# Patient Record
Sex: Female | Born: 1955 | Race: White | Hispanic: No
Health system: Southern US, Community
[De-identification: ages and names within clinical notes are randomized; demographics above are authoritative.]

---

## 1999-04-20 ENCOUNTER — Ambulatory Visit (HOSPITAL_BASED_OUTPATIENT_CLINIC_OR_DEPARTMENT_OTHER): Admission: RE | Admit: 1999-04-20 | Discharge: 1999-04-20 | Payer: Self-pay | Admitting: General Surgery

## 2015-11-05 DIAGNOSIS — F32A Depression, unspecified: Secondary | ICD-10-CM | POA: Insufficient documentation

## 2015-11-05 DIAGNOSIS — E039 Hypothyroidism, unspecified: Secondary | ICD-10-CM | POA: Insufficient documentation

## 2015-11-05 DIAGNOSIS — E7800 Pure hypercholesterolemia, unspecified: Secondary | ICD-10-CM | POA: Insufficient documentation

## 2015-11-05 DIAGNOSIS — F17200 Nicotine dependence, unspecified, uncomplicated: Secondary | ICD-10-CM | POA: Insufficient documentation

## 2017-07-31 ENCOUNTER — Other Ambulatory Visit: Payer: Self-pay | Admitting: Orthopedic Surgery

## 2017-07-31 DIAGNOSIS — M545 Low back pain: Secondary | ICD-10-CM

## 2017-08-06 ENCOUNTER — Ambulatory Visit
Admission: RE | Admit: 2017-08-06 | Discharge: 2017-08-06 | Disposition: A | Discharge status: Home / Self Care | Payer: Self-pay | Source: Ambulatory Visit | Attending: Orthopedic Surgery | Admitting: Orthopedic Surgery

## 2017-08-06 DIAGNOSIS — M545 Low back pain: Secondary | ICD-10-CM

## 2017-08-12 ENCOUNTER — Other Ambulatory Visit: Payer: Self-pay

## 2017-08-15 ENCOUNTER — Ambulatory Visit
Admission: RE | Admit: 2017-08-15 | Discharge: 2017-08-15 | Disposition: A | Discharge status: Home / Self Care | Payer: 59 | Source: Ambulatory Visit | Attending: Orthopedic Surgery | Admitting: Orthopedic Surgery

## 2019-02-10 DIAGNOSIS — L918 Other hypertrophic disorders of the skin: Secondary | ICD-10-CM | POA: Insufficient documentation

## 2019-03-24 IMAGING — MR MR LUMBAR SPINE W/O CM
4 of 5 series · 25 of 48 positions shown · non-contrast
Comparison: [REDACTED] lumbar radiographs
11/26/2009

CLINICAL DATA: 61-year-old female. Lumbar back pain radiating down
the left leg with weakness and numbness since 07/22/2017. No known
injury.

EXAM:
MRI LUMBAR SPINE WITHOUT CONTRAST
TECHNIQUE: Multiplanar, multisequence MR imaging of the lumbar spine was
performed. No intravenous contrast was administered.

[Series 5: T2 post-contrast · sagittal · 4.0mm · 0.55mm/px · 6 of 13 slices shown]
[im 1/13]
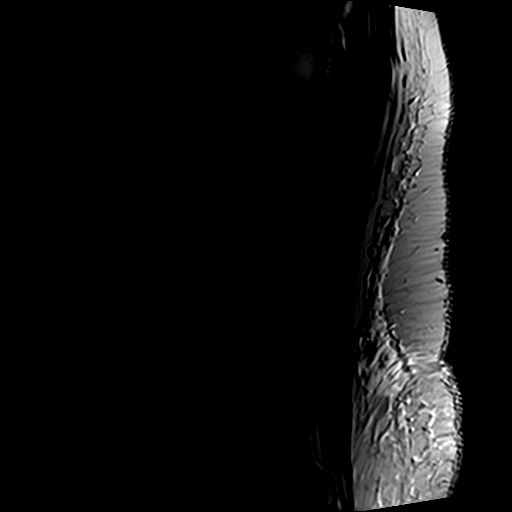
[im 3/13]
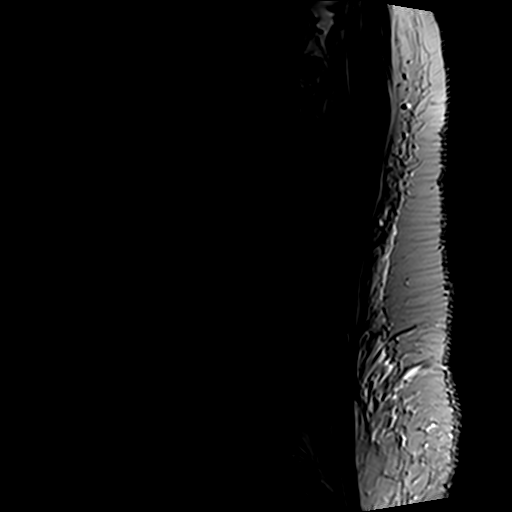
[im 5/13]
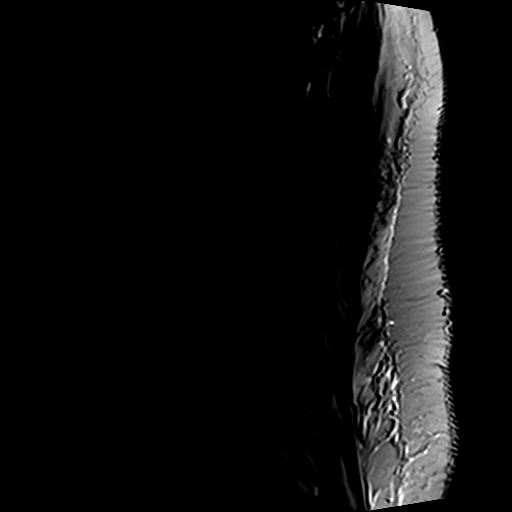
[im 8/13]
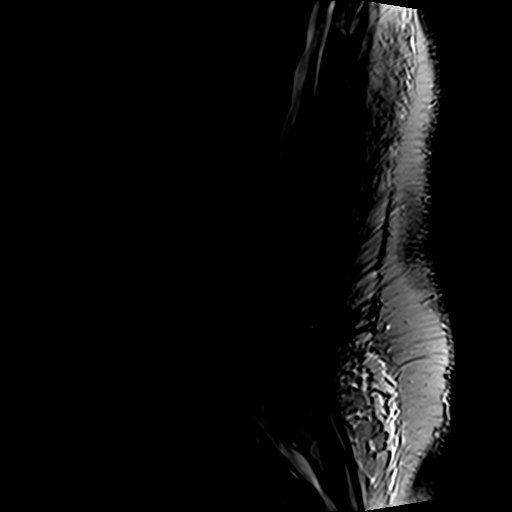
[im 10/13]
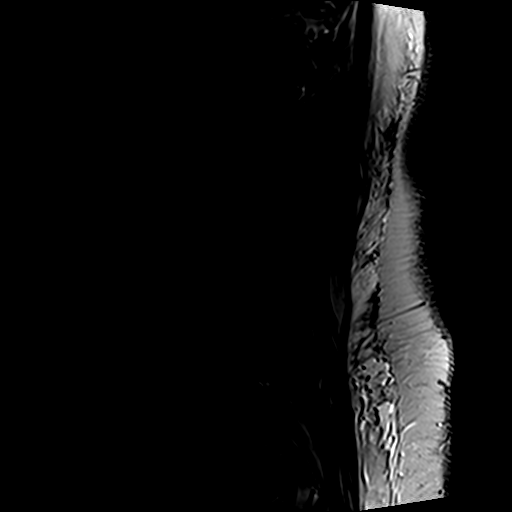
[im 13/13]
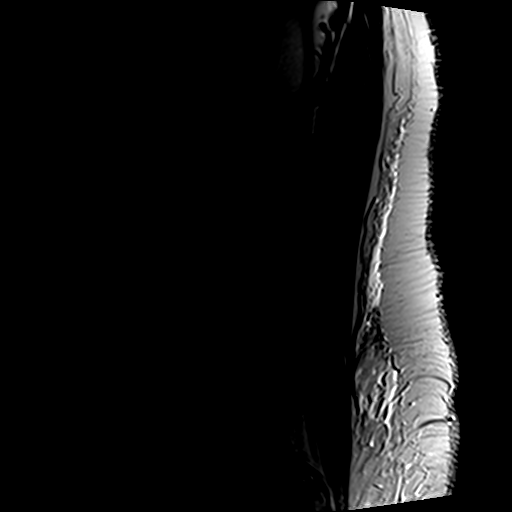

[Series 7: T1 · sagittal · 4.0mm · 0.55mm/px · 5 of 13 slices shown (1 of 2)]
[im 1/13]
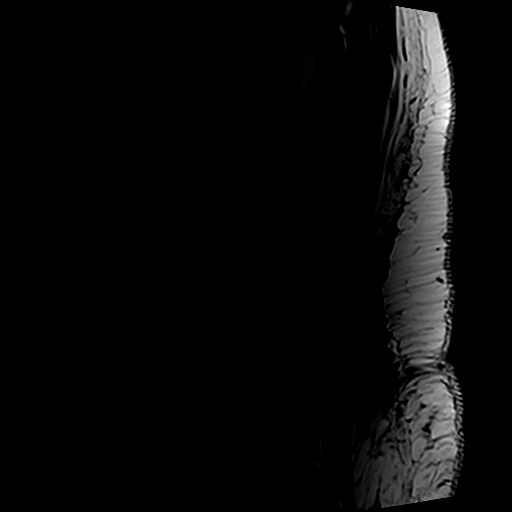
[im 4/13]
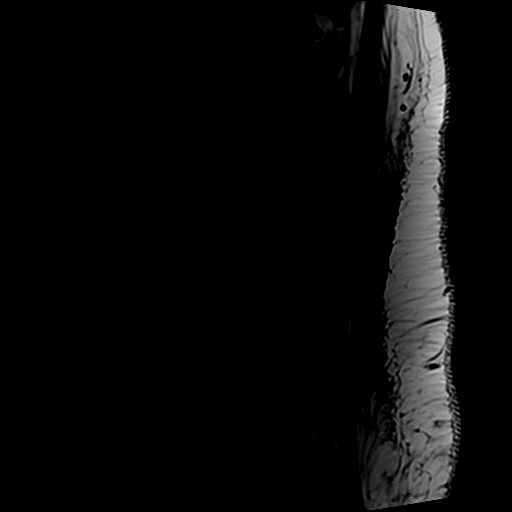
[im 7/13]
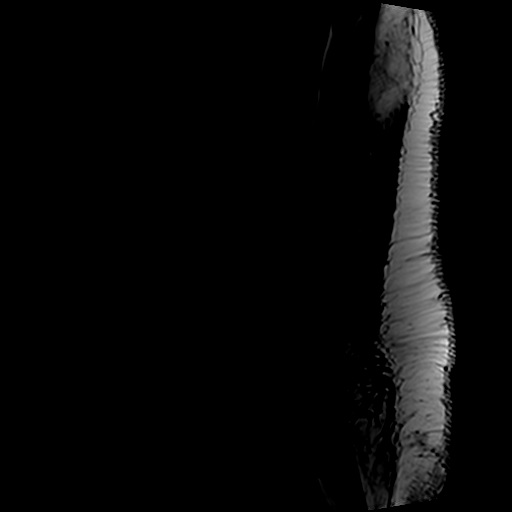
[im 10/13]
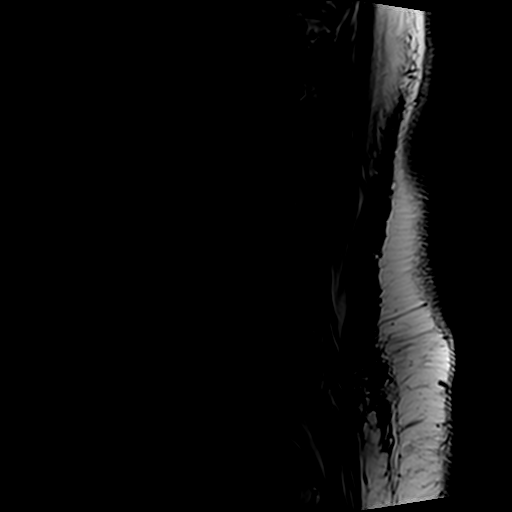
[im 13/13]
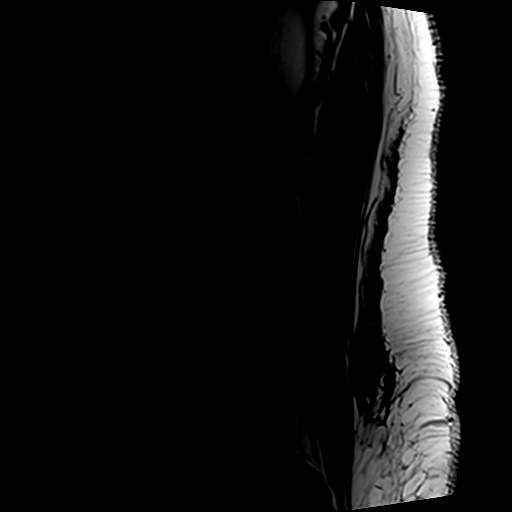

[Series 8: T2 · axial · 4.0mm · 0.70mm/px · z∈[-111,+110]mm · 10 of 39 slices shown]
[im 3/39]
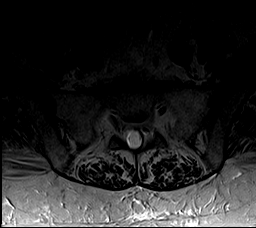
[im 6/39]
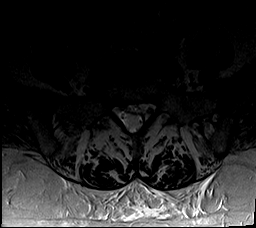
[im 8/39]
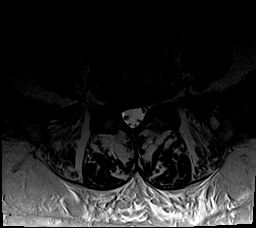
[im 13/39]
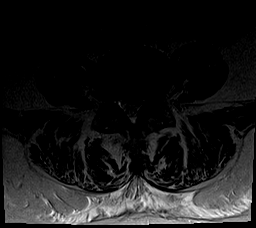
[im 18/39]
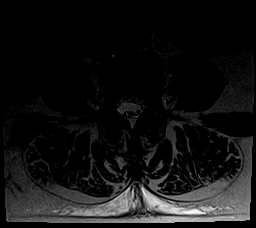
[im 21/39]
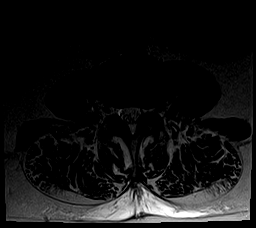
[im 23/39]
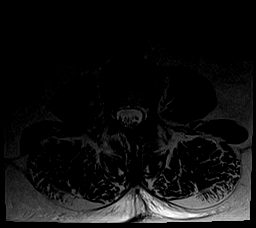
[im 28/39]
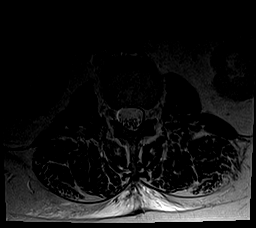
[im 33/39]
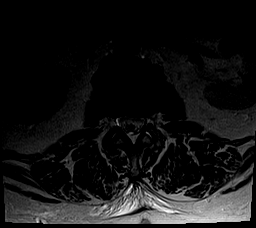
[im 39/39]
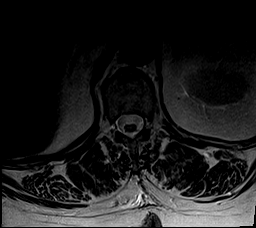

[Series 9: T1 · axial · 4.0mm · 0.35mm/px · z∈[-109,+67]mm · 4 of 39 slices shown (2 of 2)]
[im 3/39]
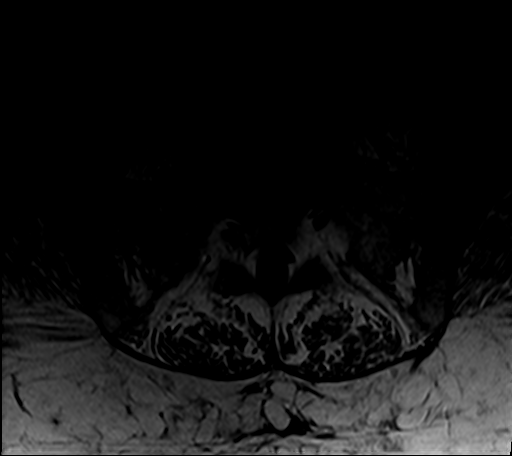
[im 6/39]
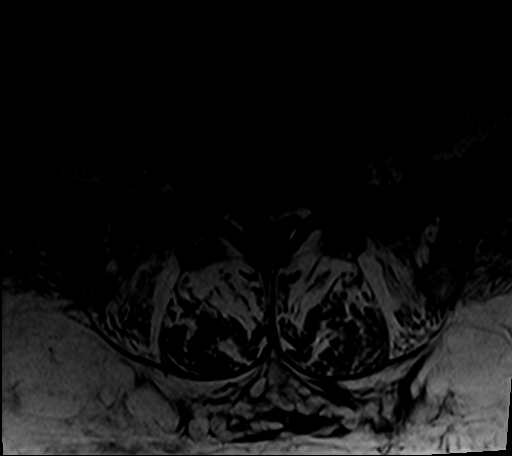
[im 21/39]
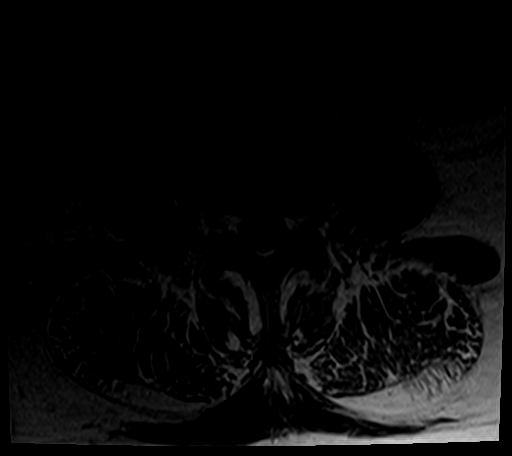
[im 33/39]
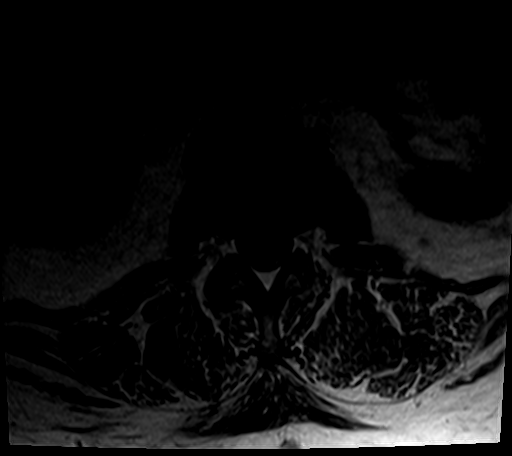

[25 of 48 positions shown; findings below may reference images not displayed]

FINDINGS: Segmentation: Demonstrated to be normal on the comparison
radiographs.

Alignment: Improved lumbar lordosis compared to 5999. Mild
retrolisthesis of L1 on L2 appears stable.

Vertebrae: Chronic degenerative endplate marrow signal changes at
L5-S1. Background bone marrow signal is normal. No marrow edema or
evidence of acute osseous abnormality. Intact visible sacrum and SI
joints.

Conus medullaris and cauda equina: Conus extends to the L1-L2 level.
Conus and cauda equina appear normal.

Paraspinal and other soft tissues: Negative.

Disc levels:

T11-T12: Mild disc bulge and facet hypertrophy.  No stenosis.

T12-L1:  Mild facet hypertrophy.

L1-L2: Mild disc desiccation and disc space loss. Chronic
circumferential disc osteophyte complex. Mild to moderate facet and
ligament flavum hypertrophy greater on the right. Trace facet joint
fluid. No significant stenosis.

L2-L3: Mild disc bulge. Mild to moderate facet hypertrophy. No
stenosis.

L3-L4: Mild circumferential but mostly far lateral disc bulging.
Broad-based left subarticular component. Mild to moderate facet
hypertrophy. Mild left L3 neural foraminal stenosis.

L4-L5: Lobulated and complex leftward disc herniation with extrusion
of disc material and/or associated epidural blood products tracking
cephalad and into the left L4 neural foramen. See series 5, image 8,
series 9, image 25 and series 8, image 26. Subsequent bulky left
subarticular mass effect. Superimposed circumferential disc bulge
with mild endplate spurring, and moderate facet and ligament flavum
hypertrophy. Mild spinal stenosis. Severe left lateral recess
stenosis (descending left L5 nerve level), and severe proximal left
L4 neural foraminal stenosis.

L5-S1: Severe disc space loss with vacuum disc. Circumferential disc
osteophyte complex with broad-based right lateral recess component
posteriorly (series 8, image 33). Mild superimposed facet
hypertrophy. Still, no significant spinal or right lateral recess
stenosis. Mild to moderate bilateral L5 neural foraminal stenosis.
IMPRESSION: 1. Symptomatic level appears to be L4-L5 where a bulky and complex
appearing leftward disc herniation results in cephalad extrusion of
disc material and/or small volume epidural blood products into the
left neural foramen. Mild associated spinal stenosis and up to
severe left neural foraminal and lateral recess stenosis. Query left
L4 and/or L5 radiculitis.
2. Advanced chronic disc and endplate degeneration at L5-S1 but
eccentric to the right. Mild to moderate bilateral L5 foraminal
stenosis at that level.
3. Relatively mild for age lumbar spine degeneration elsewhere.

## 2021-04-30 ENCOUNTER — Other Ambulatory Visit: Payer: Self-pay | Admitting: Physician Assistant

## 2021-04-30 DIAGNOSIS — Z122 Encounter for screening for malignant neoplasm of respiratory organs: Secondary | ICD-10-CM

## 2021-04-30 DIAGNOSIS — Z720 Tobacco use: Secondary | ICD-10-CM

## 2021-04-30 DIAGNOSIS — E2839 Other primary ovarian failure: Secondary | ICD-10-CM

## 2021-05-19 ENCOUNTER — Ambulatory Visit: Payer: 59

## 2021-05-28 ENCOUNTER — Inpatient Hospital Stay: Admission: RE | Admit: 2021-05-28 | Payer: 59 | Source: Ambulatory Visit

## 2021-06-22 ENCOUNTER — Ambulatory Visit
Admission: RE | Admit: 2021-06-22 | Discharge: 2021-06-22 | Disposition: A | Discharge status: Home / Self Care | Payer: 59 | Source: Ambulatory Visit | Attending: Physician Assistant | Admitting: Physician Assistant

## 2021-06-22 ENCOUNTER — Other Ambulatory Visit: Payer: Self-pay

## 2021-06-22 DIAGNOSIS — Z720 Tobacco use: Secondary | ICD-10-CM

## 2021-06-22 DIAGNOSIS — Z122 Encounter for screening for malignant neoplasm of respiratory organs: Secondary | ICD-10-CM

## 2021-06-28 DIAGNOSIS — J438 Other emphysema: Secondary | ICD-10-CM | POA: Insufficient documentation

## 2021-10-08 ENCOUNTER — Ambulatory Visit
Admission: RE | Admit: 2021-10-08 | Discharge: 2021-10-08 | Disposition: A | Discharge status: Home / Self Care | Payer: 59 | Source: Ambulatory Visit | Attending: Physician Assistant | Admitting: Physician Assistant

## 2021-10-08 DIAGNOSIS — E2839 Other primary ovarian failure: Secondary | ICD-10-CM

## 2021-11-17 ENCOUNTER — Encounter: Payer: Self-pay | Admitting: Podiatry

## 2021-11-17 ENCOUNTER — Ambulatory Visit: Payer: 59

## 2021-11-17 ENCOUNTER — Ambulatory Visit: Payer: 59 | Admitting: Podiatry

## 2021-11-17 DIAGNOSIS — M7751 Other enthesopathy of right foot: Secondary | ICD-10-CM | POA: Diagnosis not present

## 2021-11-17 DIAGNOSIS — M7661 Achilles tendinitis, right leg: Secondary | ICD-10-CM

## 2021-11-17 DIAGNOSIS — Z1211 Encounter for screening for malignant neoplasm of colon: Secondary | ICD-10-CM | POA: Insufficient documentation

## 2021-11-17 MED ORDER — MELOXICAM 15 MG PO TABS: 15.0000 mg | ORAL_TABLET | Freq: Every day | ORAL | 0 refills | Status: AC | PRN

## 2021-11-17 NOTE — Patient Instructions (Signed)
Achilles Tendinitis  ?with Rehab ?Achilles tendinitis is a disorder of the Achilles tendon. The Achilles tendon connects the large calf muscles (Gastrocnemius and Soleus) to the heel bone (calcaneus). This tendon is sometimes called the heel cord. It is important for pushing-off and standing on your toes and is important for walking, running, or jumping. Tendinitis is often caused by overuse and repetitive microtrauma. ?SYMPTOMS ?Pain, tenderness, swelling, warmth, and redness may occur over the Achilles tendon even at rest. ?Pain with pushing off, or flexing or extending the ankle. ?Pain that is worsened after or during activity. ?CAUSES  ?Overuse sometimes seen with rapid increase in exercise programs or in sports requiring running and jumping. ?Poor physical conditioning (strength and flexibility or endurance). ?Running sports, especially training running down hills. ?Inadequate warm-up before practice or play or failure to stretch before participation. ?Injury to the tendon. ?PREVENTION  ?Warm up and stretch before practice or competition. ?Allow time for adequate rest and recovery between practices and competition. ?Keep up conditioning. ?Keep up ankle and leg flexibility. ?Improve or keep muscle strength and endurance. ?Improve cardiovascular fitness. ?Use proper technique. ?Use proper equipment (shoes, skates). ?To help prevent recurrence, taping, protective strapping, or an adhesive bandage may be recommended for several weeks after healing is complete. ?PROGNOSIS  ?Recovery may take weeks to several months to heal. ?Longer recovery is expected if symptoms have been prolonged. ?Recovery is usually quicker if the inflammation is due to a direct blow as compared with overuse or sudden strain. ?RELATED COMPLICATIONS  ?Healing time will be prolonged if the condition is not correctly treated. The injury must be given plenty of time to heal. ?Symptoms can reoccur if activity is resumed too soon. ?Untreated,  tendinitis may increase the risk of tendon rupture requiring additional time for recovery and possibly surgery. ?TREATMENT  ?The first treatment consists of rest anti-inflammatory medication, and ice to relieve the pain. ?Stretching and strengthening exercises after resolution of pain will likely help reduce the risk of recurrence. Referral to a physical therapist or athletic trainer for further evaluation and treatment may be helpful. ?A walking boot or cast may be recommended to rest the Achilles tendon. This can help break the cycle of inflammation and microtrauma. ?Arch supports (orthotics) may be prescribed or recommended by your caregiver as an adjunct to therapy and rest. ?Surgery to remove the inflamed tendon lining or degenerated tendon tissue is rarely necessary and has shown less than predictable results. ?MEDICATION  ?Nonsteroidal anti-inflammatory medications, such as aspirin and ibuprofen, may be used for pain and inflammation relief. Do not take within 7 days before surgery. Take these as directed by your caregiver. Contact your caregiver immediately if any bleeding, stomach upset, or signs of allergic reaction occur. Other minor pain relievers, such as acetaminophen, may also be used. ?Pain relievers may be prescribed as necessary by your caregiver. Do not take prescription pain medication for longer than 4 to 7 days. Use only as directed and only as much as you need. ?Cortisone injections are rarely indicated. Cortisone injections may weaken tendons and predispose to rupture. It is better to give the condition more time to heal than to use them. ?HEAT AND COLD ?Cold is used to relieve pain and reduce inflammation for acute and chronic Achilles tendinitis. Cold should be applied for 10 to 15 minutes every 2 to 3 hours for inflammation and pain and immediately after any activity that aggravates your symptoms. Use ice packs or an ice massage. ?Heat may be used before performing stretching   and  strengthening activities prescribed by your caregiver. Use a heat pack or a warm soak. ?SEEK MEDICAL CARE IF: ?Symptoms get worse or do not improve in 2 weeks despite treatment. ?New, unexplained symptoms develop. Drugs used in treatment may produce side effects. ? ?EXERCISES: ? ?RANGE OF MOTION (ROM) AND STRETCHING EXERCISES - Achilles Tendinitis  ?These exercises may help you when beginning to rehabilitate your injury. Your symptoms may resolve with or without further involvement from your physician, physical therapist or athletic trainer. While completing these exercises, remember:  ?Restoring tissue flexibility helps normal motion to return to the joints. This allows healthier, less painful movement and activity. ?An effective stretch should be held for at least 30 seconds. ?A stretch should never be painful. You should only feel a gentle lengthening or release in the stretched tissue. ? ?STRETCH  Gastroc, Standing  ?Place hands on wall. ?Extend right / left leg, keeping the front knee somewhat bent. ?Slightly point your toes inward on your back foot. ?Keeping your right / left heel on the floor and your knee straight, shift your weight toward the wall, not allowing your back to arch. ?You should feel a gentle stretch in the right / left calf. Hold this position for 10 seconds. ?Repeat 3 times. Complete this stretch 2 times per day. ? ?STRETCH  Soleus, Standing  ?Place hands on wall. ?Extend right / left leg, keeping the other knee somewhat bent. ?Slightly point your toes inward on your back foot. ?Keep your right / left heel on the floor, bend your back knee, and slightly shift your weight over the back leg so that you feel a gentle stretch deep in your back calf. ?Hold this position for 10 seconds. ?Repeat 3 times. Complete this stretch 2 times per day. ? ?STRETCH  Gastrocsoleus, Standing  ?Note: This exercise can place a lot of stress on your foot and ankle. Please complete this exercise only if specifically  instructed by your caregiver.  ?Place the ball of your right / left foot on a step, keeping your other foot firmly on the same step. ?Hold on to the wall or a rail for balance. ?Slowly lift your other foot, allowing your body weight to press your heel down over the edge of the step. ?You should feel a stretch in your right / left calf. ?Hold this position for 10 seconds. ?Repeat this exercise with a slight bend in your knee. ?Repeat 3 times. Complete this stretch 2 times per day.  ? ?STRENGTHENING EXERCISES - Achilles Tendinitis ?These exercises may help you when beginning to rehabilitate your injury. They may resolve your symptoms with or without further involvement from your physician, physical therapist or athletic trainer. While completing these exercises, remember:  ?Muscles can gain both the endurance and the strength needed for everyday activities through controlled exercises. ?Complete these exercises as instructed by your physician, physical therapist or athletic trainer. Progress the resistance and repetitions only as guided. ?You may experience muscle soreness or fatigue, but the pain or discomfort you are trying to eliminate should never worsen during these exercises. If this pain does worsen, stop and make certain you are following the directions exactly. If the pain is still present after adjustments, discontinue the exercise until you can discuss the trouble with your clinician. ? ?STRENGTH - Plantar-flexors  ?Sit with your right / left leg extended. Holding onto both ends of a rubber exercise band/tubing, loop it around the ball of your foot. Keep a slight tension in the band. ?Slowly   push your toes away from you, pointing them downward. ?Hold this position for 10 seconds. Return slowly, controlling the tension in the band/tubing. ?Repeat 3 times. Complete this exercise 2 times per day.  ? ?STRENGTH - Plantar-flexors  ?Stand with your feet shoulder width apart. Steady yourself with a wall or table  using as little support as needed. ?Keeping your weight evenly spread over the width of your feet, rise up on your toes.* ?Hold this position for 10 seconds. ?Repeat 3 times. Complete this exercise 2 times per day.  ?

## 2021-11-20 NOTE — Progress Notes (Signed)
Subjective:  ? ?Patient ID: Brandi Stout, female   DOB: 66 y.o.   MRN: [id number]  ? ?HPI ?66 year old female presents the office today with concerns of bone spur, pain to the back of her right heel.  She states this has been ongoing for about 6 months been getting worse over the last 1 month.  She denies any recent injury or trauma.  She recently had x-rays performed which revealed a bone spur.  She states that she has tried steroids as well as muscle relaxers with minimal improvement.  She is also tried icing. ? ?She currently does smoke approximate 10 cigarettes/day. ? ? ?Review of Systems  ?All other systems reviewed and are negative. ? ?History reviewed. No pertinent past medical history. ? ?History reviewed. No pertinent surgical history. ? ? ?Current Outpatient Medications:  ?  meloxicam (MOBIC) 15 MG tablet, Take 1 tablet (15 mg total) by mouth daily as needed for pain., Disp: 14 tablet, Rfl: 0 ?  buPROPion (WELLBUTRIN XL) 300 MG 24 hr tablet, Take 300 mg by mouth daily., Disp: , Rfl:  ?  levothyroxine (SYNTHROID) 150 MCG tablet, Take 150 mcg by mouth daily., Disp: , Rfl:  ?  meloxicam (MOBIC) 7.5 MG tablet, Take 7.5 mg by mouth daily., Disp: , Rfl:  ?  rosuvastatin (CRESTOR) 20 MG tablet, Take 20 mg by mouth daily., Disp: , Rfl:  ? ?No Known Allergies ? ? ? ?   ?Objective:  ?Physical Exam  ?General: AAO x3, NAD ? ?Dermatological: Skin is warm, dry and supple bilateral. . There are no open sores, no preulcerative lesions, no rash or signs of infection present. ? ?Vascular: Dorsalis Pedis artery and Posterior Tibial artery pedal pulses are 2/4 bilateral with immedate capillary fill time. There is no pain with calf compression, swelling, warmth, erythema.  ? ?Neruologic: Grossly intact via light touch bilateral.  Negative Tinel sign. ? ?Musculoskeletal: Prominence of the posterior calcaneus and palpable bone spurs noted.  There is on the distal portion Achilles tendon along the insertion of the calcaneus.  No  deficit is noted in the Achilles tendon appears to be intact.  Minimal edema.  No erythema.  No pain with lateral compression of calcaneus.  No areas of pinpoint tenderness noted otherwise.  Muscular strength 5/5 in all groups tested bilateral. ? ?Gait: Unassisted, Nonantalgic.  ? ? ?   ?Assessment:  ? ?66 year old female with posterior heel spur, insertional Achilles tendinitis ? ?   ?Plan:  ?-Treatment options discussed including all alternatives, risks, and complications ?-Etiology of symptoms were discussed ?-I was not able to see the prior x-rays but I could read the x-ray report.  No acute fracture or malalignment.  Large posterior calcaneal enthesophyte with prominence of the posterior calcaneal tuberosity and swelling noted.  Mild midfoot tarsometatarsal degenerative changes. ?-We discussed with conservative as well as surgical options.  I would recommend holding off on any surgical intervention given her history of smoking and also would recommend continuing with conservative care for now.  We discussed stretching, icing on a regular basis.  Offered physical therapy which she will do this on her own at home.  Discussion modifications, arch support as well as a heel lift.  Dispensed a gel Achilles heel sleeve to help pad the bone spur.   ? ?Return if symptoms worsen or fail to improve. ? ?Vivi Barrack DPM ? ?   ? ?

## 2022-07-12 ENCOUNTER — Other Ambulatory Visit: Payer: Self-pay | Admitting: Physician Assistant

## 2022-07-12 ENCOUNTER — Ambulatory Visit
Admission: RE | Admit: 2022-07-12 | Discharge: 2022-07-12 | Disposition: A | Discharge status: Home / Self Care | Payer: 59 | Source: Ambulatory Visit | Attending: Physician Assistant | Admitting: Physician Assistant

## 2022-07-12 DIAGNOSIS — R0602 Shortness of breath: Secondary | ICD-10-CM

## 2022-07-12 DIAGNOSIS — R062 Wheezing: Secondary | ICD-10-CM
